# Patient Record
Sex: Female | Born: 1960 | Race: White | Hispanic: No | Marital: Married | Smoking: Never smoker
Health system: Southern US, Community
[De-identification: ages and names within clinical notes are randomized; demographics above are authoritative.]

## PROBLEM LIST (undated history)

## (undated) HISTORY — PX: BUNIONECTOMY: SHX129

## (undated) HISTORY — PX: OTHER SURGICAL HISTORY: SHX169

## (undated) HISTORY — PX: TONSILLECTOMY: SUR1361

---

## 2013-05-25 ENCOUNTER — Ambulatory Visit: Payer: Self-pay | Admitting: Physician Assistant

## 2013-06-01 ENCOUNTER — Ambulatory Visit: Payer: Self-pay | Admitting: Family Medicine

## 2013-06-23 ENCOUNTER — Ambulatory Visit: Payer: Self-pay | Admitting: Physician Assistant

## 2013-06-23 LAB — CBC WITH DIFFERENTIAL/PLATELET
BASOS PCT: 0.5 %
Basophil #: 0 10*3/uL (ref 0.0–0.1)
EOS ABS: 0 10*3/uL (ref 0.0–0.7)
EOS PCT: 0.2 %
HCT: 37.8 % (ref 35.0–47.0)
HGB: 12.7 g/dL (ref 12.0–16.0)
LYMPHS PCT: 13 %
Lymphocyte #: 1 10*3/uL (ref 1.0–3.6)
MCH: 29.9 pg (ref 26.0–34.0)
MCHC: 33.6 g/dL (ref 32.0–36.0)
MCV: 89 fL (ref 80–100)
MONO ABS: 0.3 x10 3/mm (ref 0.2–0.9)
Monocyte %: 4.4 %
NEUTROS ABS: 6.6 10*3/uL — AB (ref 1.4–6.5)
NEUTROS PCT: 81.9 %
Platelet: 225 10*3/uL (ref 150–440)
RBC: 4.25 10*6/uL (ref 3.80–5.20)
RDW: 13.6 % (ref 11.5–14.5)
WBC: 8 10*3/uL (ref 3.6–11.0)

## 2013-06-23 LAB — BASIC METABOLIC PANEL
Anion Gap: 12 (ref 7–16)
BUN: 14 mg/dL (ref 7–18)
Calcium, Total: 8.8 mg/dL (ref 8.5–10.1)
Chloride: 102 mmol/L (ref 98–107)
Co2: 25 mmol/L (ref 21–32)
Creatinine: 0.8 mg/dL (ref 0.60–1.30)
EGFR (African American): >60
Glucose: 102 mg/dL — ABNORMAL HIGH (ref 65–99)
Osmolality: 278 (ref 275–301)
Potassium: 3.8 mmol/L (ref 3.5–5.1)
Sodium: 139 mmol/L (ref 136–145)

## 2013-06-23 LAB — OCCULT BLOOD X 1 CARD TO LAB, STOOL: Occult Blood, Feces: POSITIVE

## 2015-03-22 ENCOUNTER — Ambulatory Visit
Admission: EM | Admit: 2015-03-22 | Discharge: 2015-03-22 | Disposition: A | Discharge status: Home / Self Care | Payer: Managed Care, Other (non HMO) | Attending: Family Medicine | Admitting: Family Medicine

## 2015-03-22 ENCOUNTER — Ambulatory Visit (INDEPENDENT_AMBULATORY_CARE_PROVIDER_SITE_OTHER): Payer: Managed Care, Other (non HMO)

## 2015-03-22 ENCOUNTER — Ambulatory Visit: Payer: Managed Care, Other (non HMO)

## 2015-03-22 DIAGNOSIS — S61239A Puncture wound without foreign body of unspecified finger without damage to nail, initial encounter: Secondary | ICD-10-CM

## 2015-03-22 DIAGNOSIS — S6000XA Contusion of unspecified finger without damage to nail, initial encounter: Secondary | ICD-10-CM | POA: Diagnosis not present

## 2015-03-22 MED ORDER — MUPIROCIN 2 % EX OINT: 1.0000 "application " | TOPICAL_OINTMENT | Freq: Three times a day (TID) | CUTANEOUS | Status: AC

## 2015-03-22 NOTE — ED Notes (Signed)
Patient c/o painful left index finger with burning pain radiating down into left hand as well.  She was using an [employer], it slipped off the head of the screw, and drill tip went into her index finger.  This happened at about 2:15pm this afternoon.  Ice has made her pain worse and it did not help much with the swelling.  Lastly, she describes her left index finger as feeling like it is on fire.

## 2015-03-22 NOTE — Discharge Instructions (Signed)
Contusion A contusion is a deep bruise. Contusions are the result of a blunt injury to tissues and muscle fibers under the skin. The injury causes bleeding under the skin. The skin overlying the contusion may turn blue, purple, or yellow. Minor injuries will give you a painless contusion, but more severe contusions may stay painful and swollen for a few weeks.  CAUSES  This condition is usually caused by a blow, trauma, or direct force to an area of the body. SYMPTOMS  Symptoms of this condition include:  Swelling of the injured area.  Pain and tenderness in the injured area.  Discoloration. The area may have redness and then turn blue, purple, or yellow. DIAGNOSIS  This condition is diagnosed based on a physical exam and medical history. An X-ray, CT scan, or MRI may be needed to determine if there are any associated injuries, such as broken bones (fractures). TREATMENT  Specific treatment for this condition depends on what area of the body was injured. In general, the best treatment for a contusion is resting, icing, applying pressure to (compression), and elevating the injured area. This is often called the RICE strategy. Over-the-counter anti-inflammatory medicines may also be recommended for pain control.  HOME CARE INSTRUCTIONS   Rest the injured area.  If directed, apply ice to the injured area:  Put ice in a plastic bag.  Place a towel between your skin and the bag.  Leave the ice on for 20 minutes, 2-3 times per day.  If directed, apply light compression to the injured area using an elastic bandage. Make sure the bandage is not wrapped too tightly. Remove and reapply the bandage as directed by your health care provider.  If possible, raise (elevate) the injured area above the level of your heart while you are sitting or lying down.  Take over-the-counter and prescription medicines only as told by your health care provider. SEEK MEDICAL CARE IF:  Your symptoms do not  improve after several days of treatment.  Your symptoms get worse.  You have difficulty moving the injured area. SEEK IMMEDIATE MEDICAL CARE IF:   You have severe pain.  You have numbness in a hand or foot.  Your hand or foot turns pale or cold.   This information is not intended to replace advice given to you by your health care provider. Make sure you discuss any questions you have with your health care provider.   Document Released: 11/03/2004 Document Revised: 10/15/2014 Document Reviewed: 06/11/2014 Elsevier Interactive Patient Education 2016 [employer]  Puncture Wound A puncture wound is an injury that is caused by a sharp, thin object that goes through (penetrates) your skin. Usually, a puncture wound does not leave a large opening in your skin, so it may not bleed a lot. However, when you get a puncture wound, dirt or other materials (foreign bodies) can be forced into your wound and break off inside. This increases the chance of infection, such as tetanus. CAUSES Puncture wounds are caused by any sharp, thin object that goes through your skin, such as:  Animal teeth, as with an animal bite.  Sharp, pointed objects, such as nails, splinters of glass, fishhooks, and needles. SYMPTOMS Symptoms of a puncture wound include:  Pain.  Bleeding.  Swelling.  Bruising.  Fluid leaking from the wound.  Numbness, tingling, or loss of function. DIAGNOSIS This condition is diagnosed with a medical history and physical exam. Your wound will be checked to see if it contains any foreign bodies. You may  also have X-rays or other imaging tests. TREATMENT Treatment for a puncture wound depends on how serious the wound is. It also depends on whether the wound contains any foreign bodies. Treatment for all types of puncture wounds usually starts with:  Controlling the bleeding.  Washing out the wound with a germ-free (sterile) salt-water solution.  Checking the wound for foreign  bodies. Treatment may also include:  Having the wound opened surgically to remove a foreign object.  Closing the wound with stitches (sutures) if it continues to bleed.  Covering the wound with antibiotic ointments and a bandage (dressing).  Receiving a tetanus shot.  Receiving a rabies vaccine. HOME CARE INSTRUCTIONS Medicines  Take or apply over-the-counter and prescription medicines only as told by your health care provider.  If you were prescribed an antibiotic, take or apply it as told by your health care provider. Do not stop using the antibiotic even if your condition improves. Wound Care  There are many ways to close and cover a wound. For example, a wound can be covered with sutures, skin glue, or adhesive strips. Follow instructions from your health care provider about:  How to take care of your wound.  When and how you should change your dressing.  When you should remove your dressing.  Removing whatever was used to close your wound.  Keep the dressing dry as told by your health care provider. Do not take baths, swim, use a hot tub, or do anything that would put your wound underwater until your health care provider approves.  Clean the wound as told by your health care provider.  Do not scratch or pick at the wound.  Check your wound every day for signs of infection. Watch for:  Redness, swelling, or pain.  Fluid, blood, or pus. General Instructions  Raise (elevate) the injured area above the level of your heart while you are sitting or lying down.  If your puncture wound is in your foot, ask your health care provider if you need to avoid putting weight on your foot and for how long.  Keep all follow-up visits as told by your health care provider. This is important. SEEK MEDICAL CARE IF:  You received a tetanus shot and you have swelling, severe pain, redness, or bleeding at the injection site.  You have a fever.  Your sutures come out.  You notice  a bad smell coming from your wound or your dressing.  You notice something coming out of your wound, such as wood or glass.  Your pain is not controlled with medicine.  You have increased redness, swelling, or pain at the site of your wound.  You have fluid, blood, or pus coming from your wound.  You notice a change in the color of your skin near your wound.  You need to change the dressing frequently due to fluid, blood, or pus draining from your wound.  You develop a new rash.  You develop numbness around your wound. SEEK IMMEDIATE MEDICAL CARE IF:  You develop severe swelling around your wound.  Your pain suddenly increases and is severe.  You develop painful skin lumps.  You have a red streak going away from your wound.  The wound is on your hand or foot and you cannot properly move a finger or toe.  The wound is on your hand or foot and you notice that your fingers or toes look pale or bluish.   This information is not intended to replace advice given to you  by your health care provider. Make sure you discuss any questions you have with your health care provider.   Document Released: 11/03/2004 Document Revised: 10/15/2014 Document Reviewed: 03/19/2014 Elsevier Interactive Patient Education [employer]

## 2015-03-22 NOTE — ED Provider Notes (Signed)
CSN: [id number]     Arrival date & time 03/22/15  1509 History   First MD Initiated Contact with Patient 03/22/15 1606     Chief Complaint  Patient presents with  . Hand Pain    Burning left index finger pain and radiating into hand as well.   (Consider location/radiation/quality/duration/timing/severity/associated sxs/prior Treatment) HPI   55 year old female who presents with an injury to her left index finger. He states that today while attempting to put a stent to Screw into a board is using a lot of pressure and the square bit slipped off the screw and punctured her ulnar index finger. Is at the midportion overlying the digital nerve. She said that she applied ice but this seemed to make the pain worse and she does feel lost some numbness and tingling of the pad of her index finger. Tetanus toxoid is current in 5 years.  History reviewed. No pertinent past medical history. Past Surgical History  Procedure Laterality Date  . Left ankle surgery    . Bunionectomy Bilateral   . Tonsillectomy     History reviewed. No pertinent family history. Social History  Substance Use Topics  . Smoking status: Never Smoker   . Smokeless tobacco: Never Used  . Alcohol Use: No   OB History    No data available     Review of Systems  Constitutional: Positive for chills and activity change. Negative for fever and fatigue.  Skin: Positive for wound.  All other systems reviewed and are negative.   Allergies  Shellfish allergy and Sulfa antibiotics  Home Medications   Prior to Admission medications   Medication Sig Start Date End Date Taking? Authorizing Provider  mupirocin ointment (BACTROBAN) 2 % Apply 1 application topically 3 (three) times daily. 03/22/15   Lutricia Feil, PA-C   Meds Ordered and Administered this Visit  Medications - No data to display  BP 155/85 mmHg  Pulse 72  Temp(Src) 98.2 F (36.8 C) (Oral)  Resp 16  Ht  (1.626 m)  Wt 140 lb (63.504 kg)  BMI 24.02  kg/m2  SpO2 99% No data found.   Physical Exam  Constitutional: She is oriented to person, place, and time. She appears well-developed and well-nourished. No distress.  HENT:  Head: Normocephalic and atraumatic.  Eyes: Conjunctivae are normal. Pupils are equal, round, and reactive to light.  Neck: Normal range of motion. Neck supple.  Musculoskeletal: She exhibits edema and tenderness.  Admission of the left nondominant hand shows a small puncture/abrasion over the ulnar aspect at the mid motion of the middle phalanx. Some small size swelling of the finger. There is slight hip esthesia to light touch of the distal pad. FDS and FDP are strong and show good pull-through. There is a mild tenderness over the area of the puncture wound.  Neurological: She is alert and oriented to person, place, and time.  Skin: Skin is warm and dry. She is not diaphoretic.  Psychiatric: She has a normal mood and affect. Her behavior is normal. Judgment and thought content normal.  Nursing note and vitals reviewed.   ED Course  Procedures (including critical care time)  Labs Review Labs Reviewed - No data to display  Imaging Review No results found.   Visual Acuity Review  Right Eye Distance:   Left Eye Distance:   Bilateral Distance:    Right Eye Near:   Left Eye Near:    Bilateral Near:         MDM  1. Puncture wound of finger, left, initial encounter   2. Contusion of finger of left hand, initial encounter    New Prescriptions   MUPIROCIN OINTMENT (BACTROBAN) 2 %    Apply 1 application topically 3 (three) times daily.  Plan: 1. Diagnosis reviewed with patient 2. rx as per orders; risks, benefits, potential side effects reviewed with patient 3. Recommend supportive treatment with elevation and ice. I Bactroban 3 times a day after thorough washing of the skin. I have reviewed with Her symptoms of infection. I also instructed her in use of her hand and finger to prevent stiffness. She  will follow-up with her primary if there are any problems 4. F/u prn if symptoms worsen or don't improve     Lutricia Feil, PA-C 03/22/15 1730

## 2017-09-28 IMAGING — CR DG FINGER INDEX 2+V*L*
3 series · 3 of 3 positions shown · non-contrast
Comparison: None.

CLINICAL DATA: Second digit pain following blunt trauma with power
drill, initial encounter

EXAM:
LEFT INDEX FINGER 2+V

[finger ap]
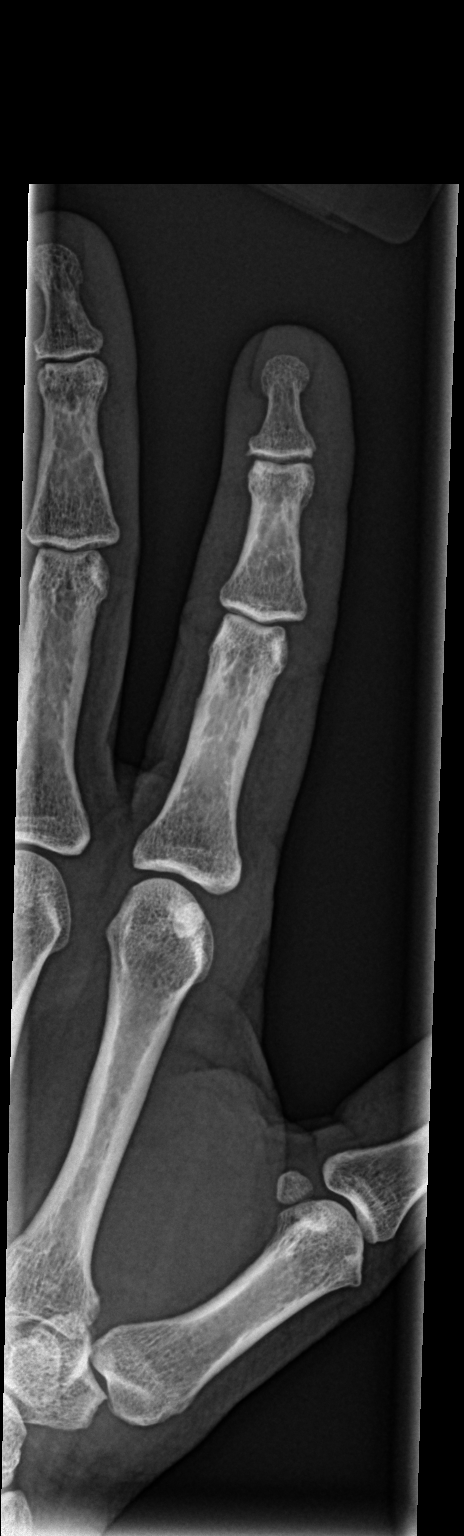

[finger obl]
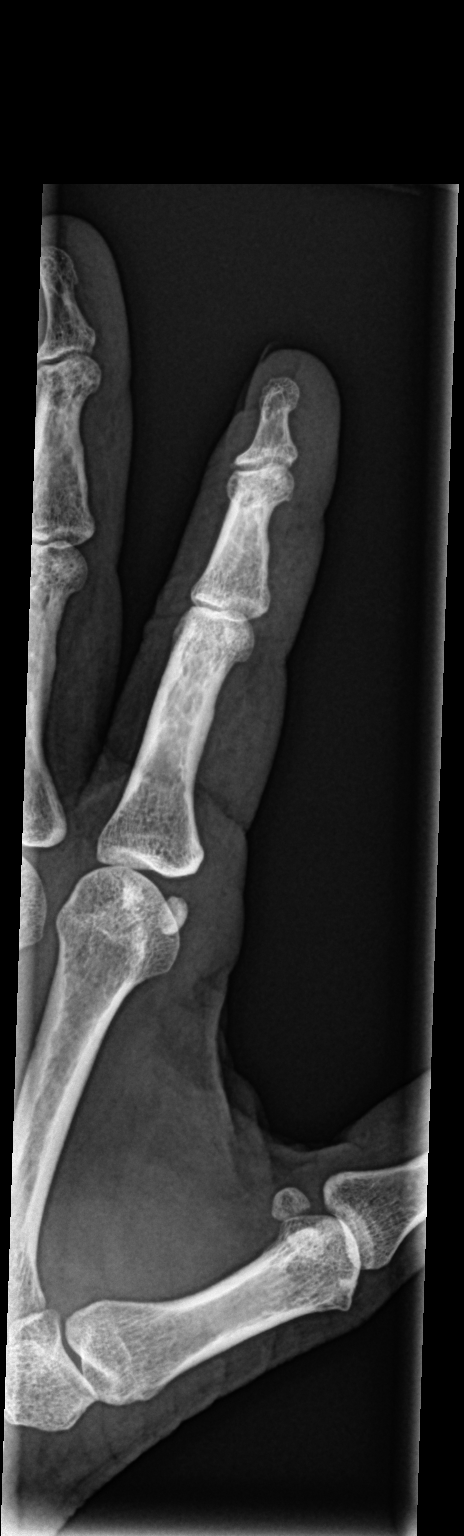

[finger lat]
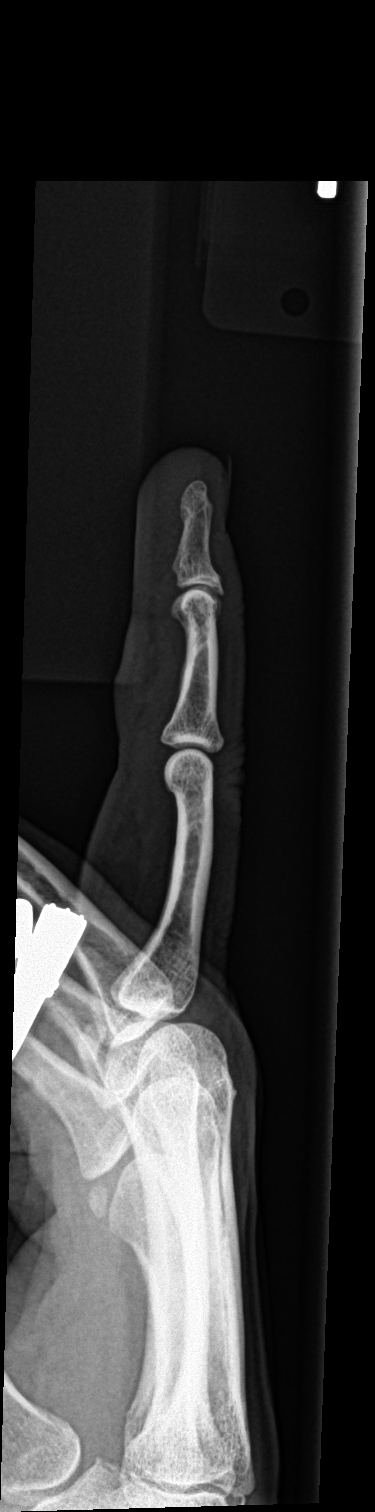

[3 of 3 positions shown; findings below may reference images not displayed]

FINDINGS: Mild degenerative changes are noted in the DIP joint. No acute
fracture or dislocation is seen. No gross soft tissue abnormality is
noted.
IMPRESSION: No acute abnormality seen.
